# Patient Record
Sex: Female | Born: 1935 | Marital: Married | Smoking: Former smoker
Health system: Southern US, Community
[De-identification: ages and names within clinical notes are randomized; demographics above are authoritative.]

## PROBLEM LIST (undated history)

## (undated) DIAGNOSIS — J449 Chronic obstructive pulmonary disease, unspecified: Secondary | ICD-10-CM

## (undated) DIAGNOSIS — R079 Chest pain, unspecified: Secondary | ICD-10-CM

## (undated) DIAGNOSIS — I10 Essential (primary) hypertension: Secondary | ICD-10-CM

## (undated) DIAGNOSIS — E785 Hyperlipidemia, unspecified: Secondary | ICD-10-CM

## (undated) DIAGNOSIS — R4781 Slurred speech: Secondary | ICD-10-CM

## (undated) DIAGNOSIS — Z87891 Personal history of nicotine dependence: Secondary | ICD-10-CM

## (undated) DIAGNOSIS — I480 Paroxysmal atrial fibrillation: Secondary | ICD-10-CM

## (undated) DIAGNOSIS — A084 Viral intestinal infection, unspecified: Secondary | ICD-10-CM

## (undated) HISTORY — DX: Essential (primary) hypertension: I10

## (undated) HISTORY — DX: Chronic obstructive pulmonary disease, unspecified: J44.9

## (undated) HISTORY — PX: WISDOM TOOTH EXTRACTION: SHX21

## (undated) HISTORY — DX: Chest pain, unspecified: R07.9

## (undated) HISTORY — PX: HEMORRHOID SURGERY: SHX153

## (undated) HISTORY — PX: PARTIAL HYSTERECTOMY: SHX80

## (undated) HISTORY — DX: Hyperlipidemia, unspecified: E78.5

## (undated) HISTORY — DX: Personal history of nicotine dependence: Z87.891

## (undated) HISTORY — DX: Slurred speech: R47.81

## (undated) HISTORY — DX: Paroxysmal atrial fibrillation: I48.0

## (undated) HISTORY — DX: Viral intestinal infection, unspecified: A08.4

## (undated) HISTORY — PX: TONSILLECTOMY: SUR1361

---

## 2011-08-06 ENCOUNTER — Encounter: Payer: Self-pay | Admitting: Cardiovascular Disease

## 2011-08-06 ENCOUNTER — Inpatient Hospital Stay: Payer: Self-pay | Admitting: Internal Medicine

## 2011-08-06 DIAGNOSIS — I499 Cardiac arrhythmia, unspecified: Secondary | ICD-10-CM

## 2011-08-06 DIAGNOSIS — I517 Cardiomegaly: Secondary | ICD-10-CM

## 2011-08-06 LAB — CBC
HCT: 44.5 % (ref 35.0–47.0)
MCHC: 33.9 g/dL (ref 32.0–36.0)
MCV: 95 fL (ref 80–100)
Platelet: 247 10*3/uL (ref 150–440)
RDW: 12.8 % (ref 11.5–14.5)

## 2011-08-06 LAB — URINALYSIS, COMPLETE
Glucose,UR: NEGATIVE mg/dL (ref 0–75)
Leukocyte Esterase: NEGATIVE
Nitrite: NEGATIVE
RBC,UR: 4 /HPF (ref 0–5)
Specific Gravity: 1.016 (ref 1.003–1.030)
Squamous Epithelial: 4
WBC UR: 2 /HPF (ref 0–5)

## 2011-08-06 LAB — COMPREHENSIVE METABOLIC PANEL
Albumin: 4 g/dL (ref 3.4–5.0)
Anion Gap: 15 (ref 7–16)
Bilirubin,Total: 0.8 mg/dL (ref 0.2–1.0)
Calcium, Total: 8.9 mg/dL (ref 8.5–10.1)
Co2: 27 mmol/L (ref 21–32)
EGFR (African American): >60
SGOT(AST): 27 U/L (ref 15–37)
Total Protein: 7.8 g/dL (ref 6.4–8.2)

## 2011-08-06 LAB — CK TOTAL AND CKMB (NOT AT ARMC)
CK, Total: 62 U/L (ref 21–215)
CK, Total: 52 U/L (ref 21–215)
CK-MB: 1 ng/mL (ref 0.5–3.6)
CK-MB: 0.9 ng/mL (ref 0.5–3.6)
CK-MB: 0.9 ng/mL (ref 0.5–3.6)

## 2011-08-06 LAB — LIPASE, BLOOD: Lipase: 198 U/L (ref 73–393)

## 2011-08-06 LAB — TROPONIN I: Troponin-I: <0.02 ng/mL

## 2011-08-07 LAB — CBC WITH DIFFERENTIAL/PLATELET
Basophil #: 0 10*3/uL (ref 0.0–0.1)
HCT: 36 % (ref 35.0–47.0)
Lymphocyte #: 0.5 10*3/uL — ABNORMAL LOW (ref 1.0–3.6)
MCHC: 33.7 g/dL (ref 32.0–36.0)
MCV: 95 fL (ref 80–100)
Monocyte %: 9.6 %
Neutrophil #: 6.6 10*3/uL — ABNORMAL HIGH (ref 1.4–6.5)
Neutrophil %: 83.8 %
RDW: 12.8 % (ref 11.5–14.5)
WBC: 7.9 10*3/uL (ref 3.6–11.0)

## 2011-08-07 LAB — COMPREHENSIVE METABOLIC PANEL
Albumin: 3 g/dL — ABNORMAL LOW (ref 3.4–5.0)
Alkaline Phosphatase: 46 U/L — ABNORMAL LOW (ref 50–136)
BUN: 8 mg/dL (ref 7–18)
Bilirubin,Total: 0.5 mg/dL (ref 0.2–1.0)
Calcium, Total: 7 mg/dL — CL (ref 8.5–10.1)
EGFR (Non-African Amer.): >60
Osmolality: 276 (ref 275–301)
Potassium: 3.5 mmol/L (ref 3.5–5.1)
SGPT (ALT): 18 U/L
Sodium: 139 mmol/L (ref 136–145)

## 2011-08-07 LAB — MAGNESIUM: Magnesium: 1.6 mg/dL — ABNORMAL LOW

## 2011-08-08 LAB — BASIC METABOLIC PANEL
Anion Gap: 6 — ABNORMAL LOW (ref 7–16)
Calcium, Total: 7.4 mg/dL — ABNORMAL LOW (ref 8.5–10.1)
Co2: 25 mmol/L (ref 21–32)
Creatinine: 0.57 mg/dL — ABNORMAL LOW (ref 0.60–1.30)
EGFR (African American): >60
Osmolality: 259 (ref 275–301)
Sodium: 130 mmol/L — ABNORMAL LOW (ref 136–145)

## 2011-08-08 LAB — MAGNESIUM: Magnesium: 2 mg/dL

## 2011-08-14 ENCOUNTER — Encounter: Payer: Self-pay | Admitting: Nurse Practitioner

## 2011-08-14 ENCOUNTER — Ambulatory Visit (INDEPENDENT_AMBULATORY_CARE_PROVIDER_SITE_OTHER): Payer: Medicare Other | Admitting: Nurse Practitioner

## 2011-08-14 VITALS — BP 140/68 | HR 86 | Ht 61.0 in | Wt 144.0 lb

## 2011-08-14 DIAGNOSIS — I4891 Unspecified atrial fibrillation: Secondary | ICD-10-CM

## 2011-08-14 DIAGNOSIS — J449 Chronic obstructive pulmonary disease, unspecified: Secondary | ICD-10-CM

## 2011-08-14 DIAGNOSIS — E785 Hyperlipidemia, unspecified: Secondary | ICD-10-CM

## 2011-08-14 DIAGNOSIS — R079 Chest pain, unspecified: Secondary | ICD-10-CM

## 2011-08-14 DIAGNOSIS — I480 Paroxysmal atrial fibrillation: Secondary | ICD-10-CM

## 2011-08-14 DIAGNOSIS — I10 Essential (primary) hypertension: Secondary | ICD-10-CM

## 2011-08-14 DIAGNOSIS — Z87891 Personal history of nicotine dependence: Secondary | ICD-10-CM | POA: Insufficient documentation

## 2011-08-14 MED ORDER — METOPROLOL TARTRATE 50 MG PO TABS: 50.0000 mg | ORAL_TABLET | Freq: Two times a day (BID) | ORAL | Status: AC

## 2011-08-14 MED ORDER — FLUTICASONE-SALMETEROL 100-50 MCG/DOSE IN AEPB: 1.0000 | INHALATION_SPRAY | Freq: Two times a day (BID) | RESPIRATORY_TRACT | Status: AC

## 2011-08-14 NOTE — Progress Notes (Signed)
Patient Name: Alyssa Tucker Date of Encounter: 08/14/2011  Primary Care Provider:  Dorothey Baseman, MD, MD Primary Cardiologist:  Concha Se, MD  Patient Profile  78 y/o female who presents following recent hospitalization for gastroenteritis complicated by af w/ rvr.  Problem List   Past Medical History  Diagnosis Date  . Viral gastroenteritis     a. 07/2011  . Paroxysmal a-fib     a. Dx 07/2011;  b. 08/06/2011 Echo: EF >555, Diast dysfxn, NL LA size, no signif valvular abnormalities.  The 40s and he is longer than the left and in the ICU the day of his side of his  . Hypertension   . COPD (chronic obstructive pulmonary disease)     a. Rx O2 for exertion in past - doesn't use.  Marland Kitchen Hyperlipidemia   . Slurred speech     a. 07/2010 during hospitalization - NL Carotid dopplers  . History of tobacco abuse     a. 40 pack yr history - no longer smokes.  . Exertional chest pain     a. 07/2011 - refusing myoview @ this time.   Past Surgical History  Procedure Date  . Wisdom tooth extraction   . Partial hysterectomy   . Tonsillectomy   . Hemorrhoid surgery     Allergies  No Known Allergies  HPI  78 y/o female with the above problem list.  She was recently hospitalized @ [facility] with gastroenteritis.  She was found to have tachycardia and A. Fib with RVR which subsequently broke while on beta blocker therapy.  As atrial fibrillation appear to be secondary to systemic illness, she was not placed on systemic anticoagulation.  She was discharged home on metoprolol therapy however she chose not to have that prescription filled instead has continued to take Diovan which he has been on for hypertension for a few years.  She does not feel that she had any additional palpitations consistent with A. Fib.  The patient has chronic dyspnea on exertion and when she was discharged from the hospital was placed on oxygen therapy to be used with exertion.  She says her breathing is improved significantly  since discharge and she is no longer using the oxygen.  Patient also reports some degree of chest tightness that occurs with exertion.  She says she simply doesn't exert herself as much in order to avoid experiencing chest tightness.  She is fairly nondescript regarding how long this has been going on and is unwilling to commit to how often it occurs.  Home Medications  Prior to Admission medications   Medication Sig Start Date End Date Taking? Authorizing Provider  Fluticasone-Salmeterol (ADVAIR) 100-50 MCG/DOSE AEPB Inhale 1 puff into the lungs every 12 (twelve) hours. 08/14/11  Yes Ok Anis, NP  valsartan (DIOVAN) 160 MG tablet Take 160 mg by mouth daily.   Yes Historical Provider, MD  aspirin EC 81 MG tablet Take 1 tablet (81 mg total) by mouth daily. 08/14/11   Ok Anis, NP  metoprolol (LOPRESSOR) 50 MG tablet Take 1 tablet (50 mg total) by mouth 2 (two) times daily. 08/14/11 08/13/12  Ok Anis, NP    Family History  Family History  Problem Relation Age of Onset  . Dementia Mother     died @ 80    Social History  History   Social History  . Marital Status: Married    Spouse Name: N/A    Number of Children: N/A  . Years of Education: N/A  Occupational History  . retired    Social History Main Topics  . Smoking status: Former Smoker -- 1.0 packs/day for 40 years    Types: Cigarettes    Quit date: 08/14/2003  . Smokeless tobacco: Not on file  . Alcohol Use: 0.5 oz/week    1 drink(s) per week     Previously drank a glass of wine with dinner everynight but now only has an occasional glass.  . Drug Use: No  . Sexually Active: Not on file   Other Topics Concern  . Not on file   Social History Narrative   Lives with husband in local retirement community.  Dtr is nearby.  Does not routinely exercise.     Review of Systems General:  No chills, fever, night sweats or weight changes.  Cardiovascular:  +++ chest pain & dyspnea on exertion.   No edema, orthopnea, palpitations, paroxysmal nocturnal dyspnea. Dermatological: No rash, lesions/masses Respiratory: No cough, dyspnea Urologic: No hematuria, dysuria Abdominal:   No nausea, vomiting, diarrhea, bright red blood per rectum, melena, or hematemesis Neurologic:  No visual changes, wkns, changes in mental status. All other systems reviewed and are otherwise negative except as noted above.  Physical Exam  Blood pressure 140/68, pulse 86, height 5\' 1"  (1.549 m), weight 144 lb (65.318 kg).  General: Pleasant, NAD Psych: Normal affect. Neuro: Alert and oriented X 3. Moves all extremities spontaneously. HEENT: Normal  Neck: Supple without bruits or JVD. Lungs:  Resp regular and unlabored.  Diminished breath sounds bilat. Heart: RRR no s3, s4, or murmurs. Abdomen: Soft, non-tender, non-distended, BS + x 4.  Extremities: No clubbing, cyanosis or edema. DP/PT/Radials 2+ and equal bilaterally.  Accessory Clinical Findings  ECG - RSR, 86, RBBB, poor R prog, LAD. - No acute changes.  Assessment & Plan  1.  PAF:  Pt had PAF in setting of presumed viral gastroenteritis and UTI.  She was prescribed lopressor 50mg  bid @ the time of d/c from the hospital but she preferred to continue to take her home dose of Diovan instead.  We discussed why metoprolol may be a better choice for treatment of her BP given her dx of afib.  She is willing to accept a Rx for metoprolol but is clear that she plans to read up on it first and may or may not pick it up @ the pharmacy.  We've sent in a Rx for metoprolol 50mg  bid to her pharmacy.  She's advised that when she begins to take it, she can stop her Diovan.  Her CHADS2= 2.  As her afib occurred in the setting of a viral illness with hypokalemia and hypomagnesemia, we will not pursue systemic anticoagulation at this time.  I have advised however that she begin using ASA 81mg  Daily - which she is willing to do.  2.  HTN:  See discussion above re: switching  from diovan to metoprolol.  3.  COPD:  Pt was prescribed O2 for home use during exertion.  She says she feels much better and hasn't been using it.  We performed ambulatory pulse oximetry while she was here today and she maintained her saturations above 92% (<88% is cutoff for insurance coverage).  This being the case, it does not appear that she requires O2 @ this point.  She uses advair bid @ home.  She does not currently have a PCP and has requested a refill, this has been e-scribed for her.  4.  Midsternal Chest pain:  When pt was  hospitalized, even with fast heart rates, she apparently did not have any chest pain.  When questioned today, she does report a h/o exertional chest discomfort on occasion, which she says she can control simply by not exerting herself.  We discussed stress testing at length, which was also discussed with her while she was hospitalized, and she does not wish to proceed with pharmacologic testing at this time.  I advised that if she notes recurrent chest discomfort or worsening DOE, that she should contact us so that we can arrange an ischemic evaluation.  Her and her dtr were agreeable with that approach.  BB prescribed as above.   Nicolasa Ducking, NP 08/14/2011, 4:49 PM

## 2011-08-14 NOTE — Patient Instructions (Signed)
Your physician recommends that you schedule a follow-up appointment in: 3 MONTHS WITH DR. Mariah Milling  Your physician has recommended you make the following change in your medication: WHEN YOU DO STOP THE DIOVAN YOU ARE TO START METOPROLOL TARTRATE (LOPRESSOR) 50 MG 1 TABLET TWICE DAILY

## 2011-08-18 ENCOUNTER — Telehealth: Payer: Self-pay | Admitting: Cardiovascular Disease

## 2011-08-18 NOTE — Telephone Encounter (Signed)
Pt wants Dr to write script for her to DC her O2. Pt saw Ward Givens on 3/28 and she says he told her he would do it.

## 2011-08-18 NOTE — Telephone Encounter (Signed)
REVIEWED LAST OFFICE NOTE APPEARS PT NO LONGER REQUIRES O2 THERAPY NOTE WRITTEN ON PERSCRIPTION PAD AND FAXED TO  [facility]  AT [phone]

## 2011-09-16 ENCOUNTER — Encounter: Payer: Self-pay | Admitting: Cardiovascular Disease

## 2012-02-27 ENCOUNTER — Ambulatory Visit: Payer: Self-pay | Admitting: Family Medicine

## 2012-03-10 ENCOUNTER — Ambulatory Visit: Payer: Self-pay | Admitting: Family Medicine

## 2012-09-03 IMAGING — CR DG CHEST 1V PORT
1 series · 1 of 1 positions shown · non-contrast
Comparison: none

REASON FOR EXAM: short of breath
COMMENTS:

[portable]
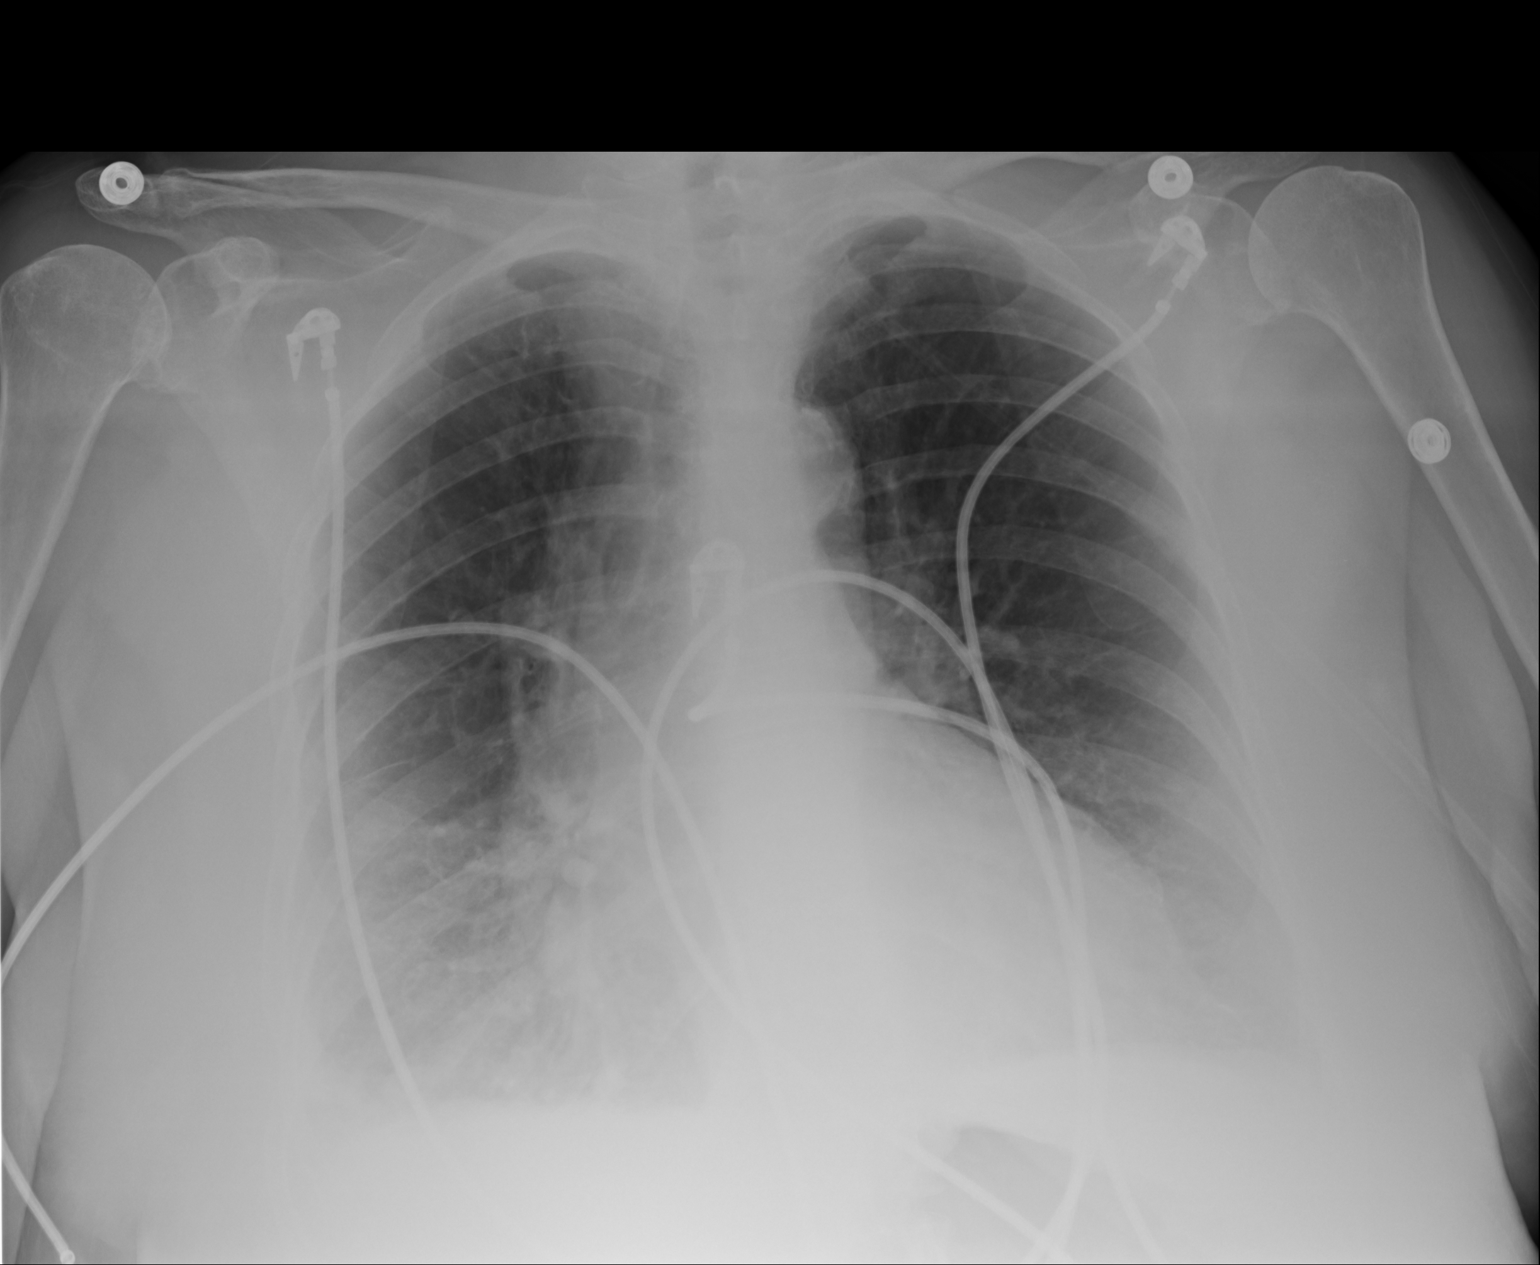

[1 of 1 positions shown; findings below may reference images not displayed]

PROCEDURE:     DXR - DXR PORTABLE CHEST SINGLE VIEW  - August 08, 2011  [DATE]

RESULT:     Comparison is made to the study 06 August, 2011.

Cardiac monitoring electrodes are present. The projection is lordotic. The
lungs are mildly hyperinflated suggestive of COPD. The lungs are clear. The
heart and pulmonary vessels are normal. The bony and mediastinal structures
are unremarkable. There is no effusion. There is no pneumothorax or evidence
of congestive failure.
IMPRESSION: No acute cardiopulmonary disease. COPD.

## 2013-03-25 IMAGING — CT CT CHEST W/ CM
1 series · 15 of 33 positions shown, 19 images · IV contrast (isovue)
Comparison: none

REASON FOR EXAM: LAB 1st  cough  abnormal  CXR   dr req W contrast
COMMENTS:

PROCEDURE:     KCT - KCT CHEST WITH CONTRAST  - February 27, 2012 [DATE]
RESULT:     Comparison: Report from chest radiograph of 02/17/2012.
TECHNIQUE: Multiple axial images of the chest were obtained with 75 mL
Isovue 300 intravenous contrast.

[Series 2: chest w/ 3.0 i31f 2 · axial · 0.70mm/px · z∈[-505,-259]mm · 15 of 98 slices shown, 19 images]
[im 8/98  mediastinal]
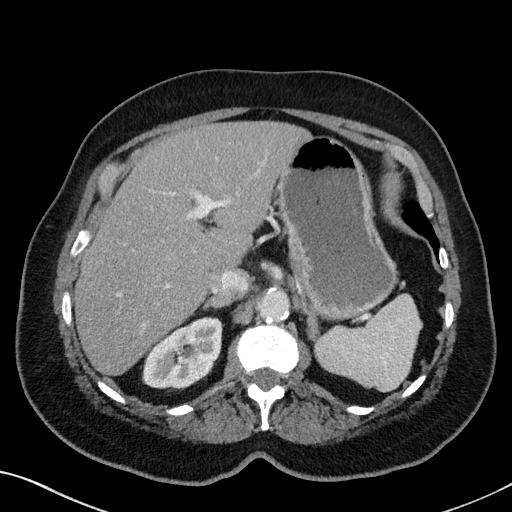
[im 8/98  lung]
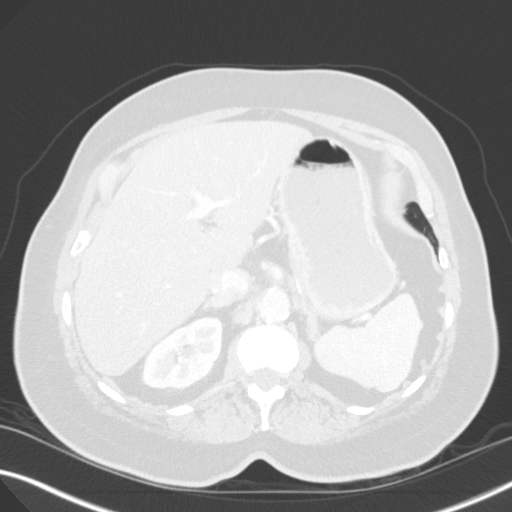
[im 15/98  lung]
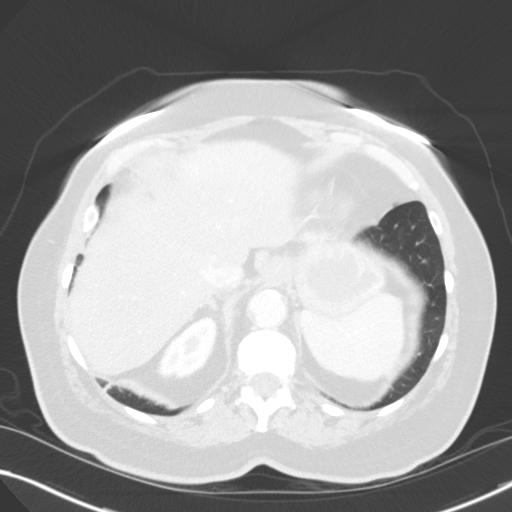
[im 20/98  lung]
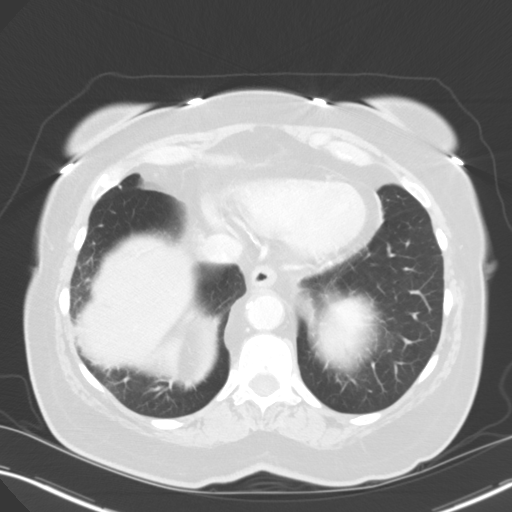
[im 26/98  lung]
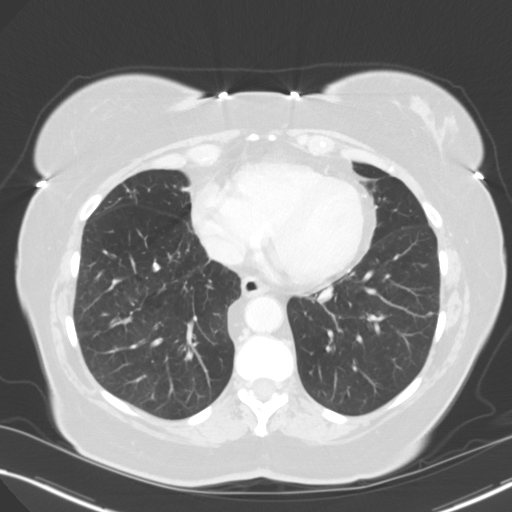
[im 33/98  mediastinal]
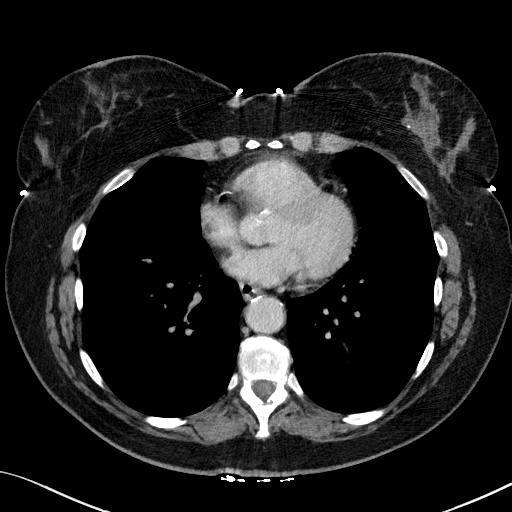
[im 33/98  lung]
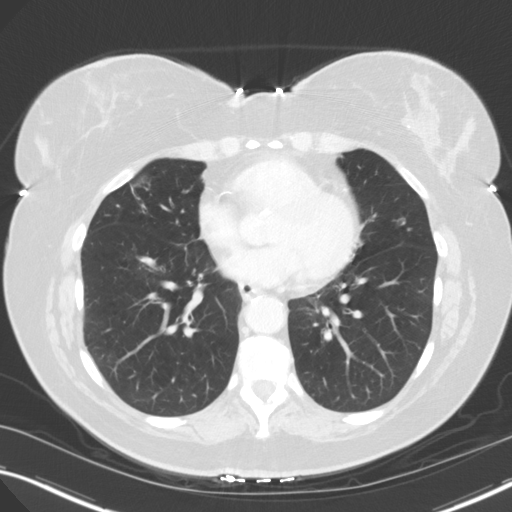
[im 39/98  lung]
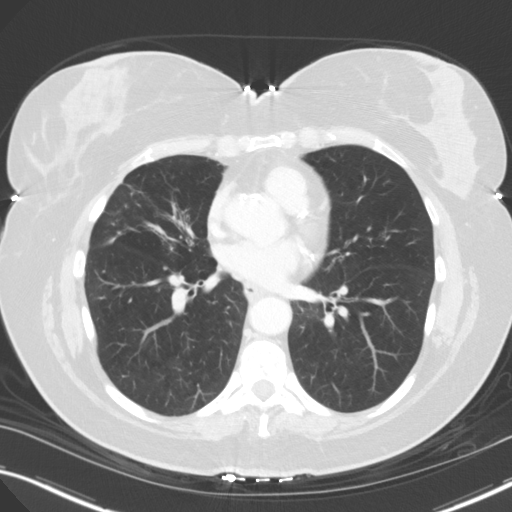
[im 44/98  lung]
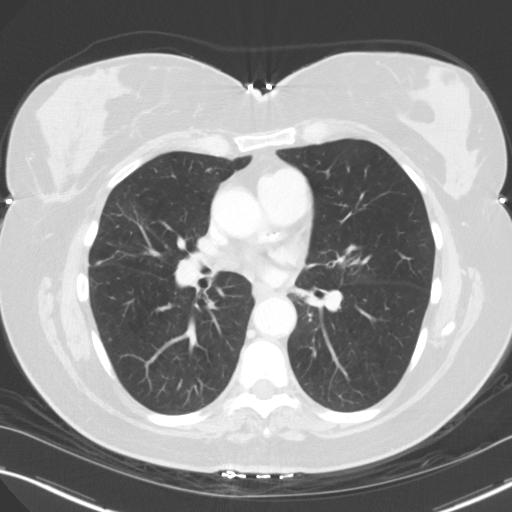
[im 51/98  lung]
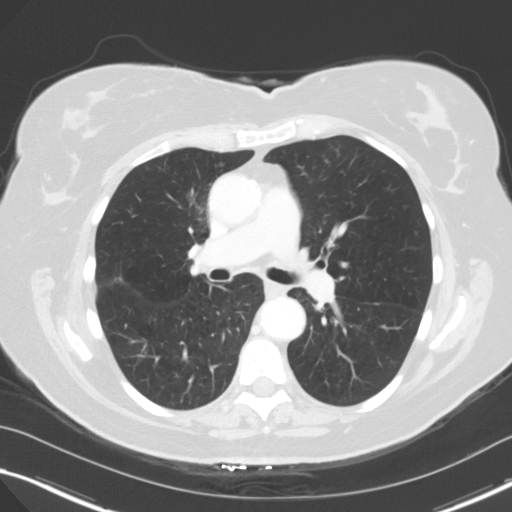
[im 54/98  mediastinal]
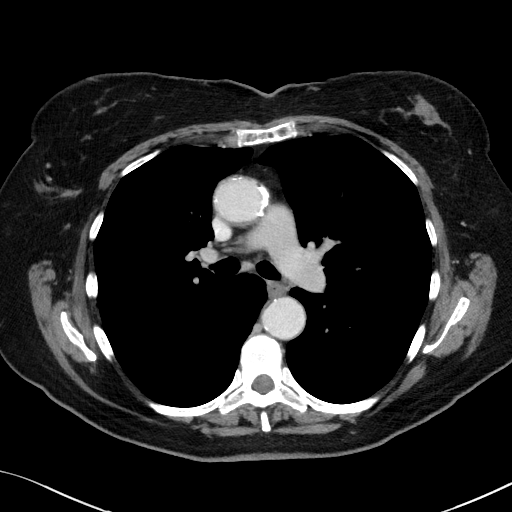
[im 54/98  lung]
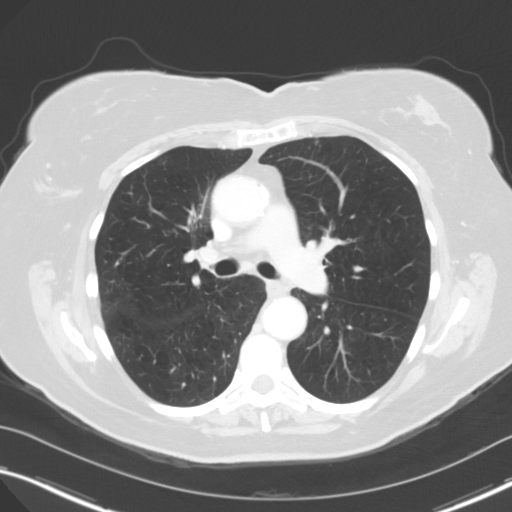
[im 59/98  lung]
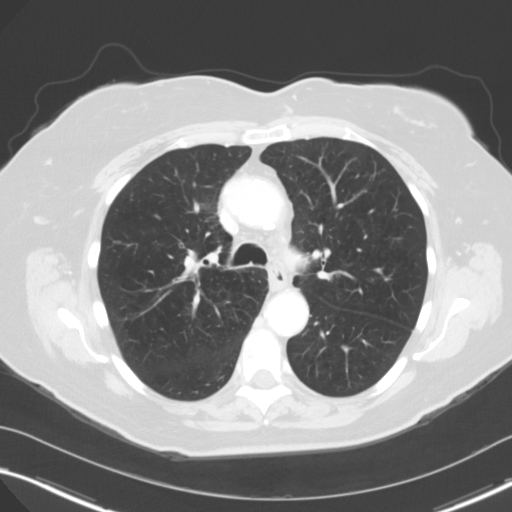
[im 65/98  lung]
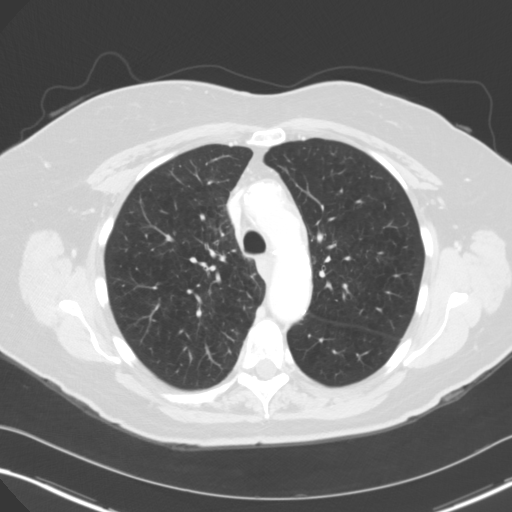
[im 72/98  lung]
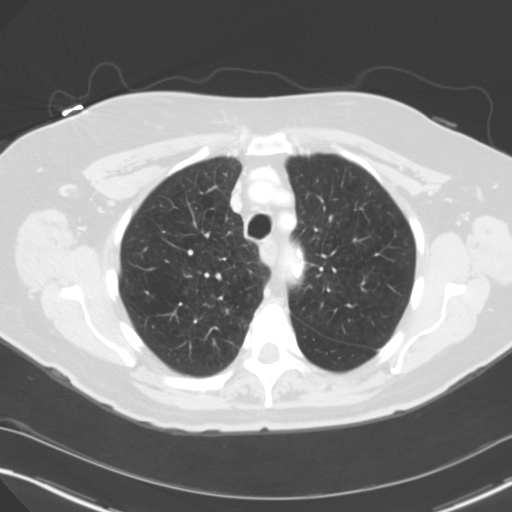
[im 78/98  mediastinal]
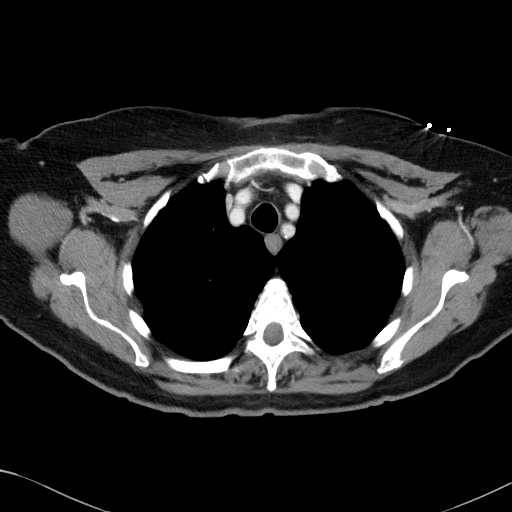
[im 78/98  lung]
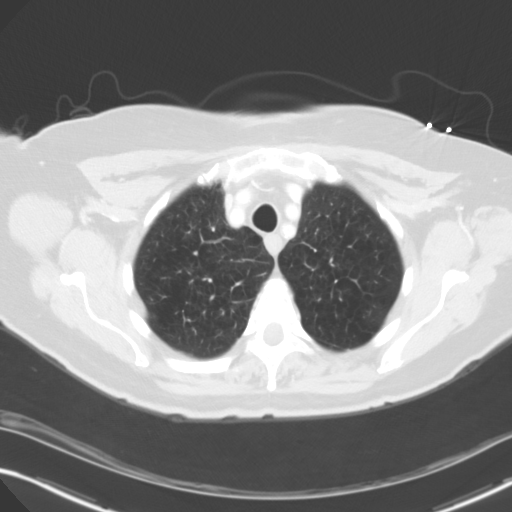
[im 83/98  lung]
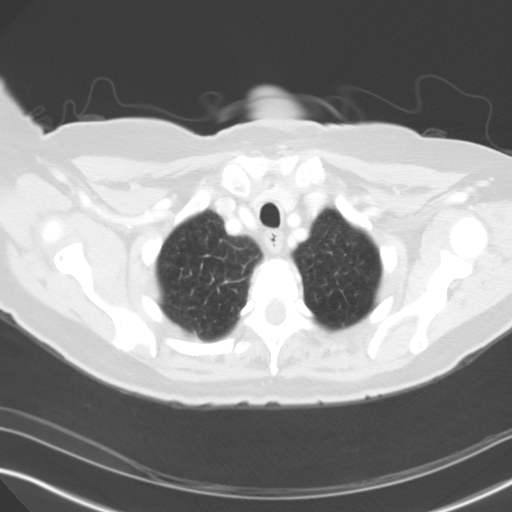
[im 90/98  lung]
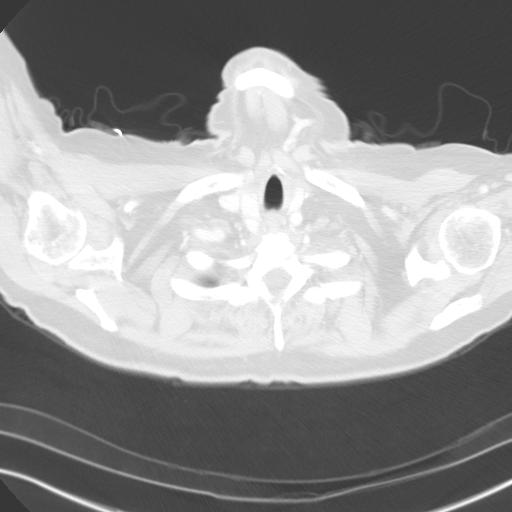

[15 of 33 positions shown; findings below may reference images not displayed]

FINDINGS: No mediastinal, hilar, or axillary lymphadenopathy. Calcifications are seen
in the coronary arteries. There is a possible small hiatal hernia.

There is moderate centrilobular emphysema. There is a 5 mm nodule in the
right middle lobe, image 57. Minimal linear opacities in the right middle
lobe are likely secondary to atelectasis or scarring. There is a 6 mm nodule
in the subpleural right lower lobe, image 62. There is a 5 mm nodule in the
subpleural left lower lobe, image 72. The central airways are patent.

No aggressive lytic or sclerotic osseous lesions are identified.
IMPRESSION: 1. Moderate centrilobular emphysema. Minimal linear opacities in the right
middle lobe are likely secondary to atelectasis.
2. Multiple indeterminate pulmonary nodules, the largest of which measures 6
mm. Recommend follow-up noncontrast chest CT in 3 to 6 months.

## 2013-04-06 IMAGING — MG MM CAD SCREENING MAMMO
1 series · 4 of 4 positions shown · non-contrast
Comparison: Mammograms from [REDACTED] 08/02/2008, digital
copies of screen film mammograms from 08/02/2007.

REASON FOR EXAM: SCR MAMMO NO ORDER
COMMENTS:

PROCEDURE:     MAM - MAM DGTL SCRN MAM NO ORDER W/CAD  - March 10, 2012  [DATE]
RESULT:

[R CC · right · 4 of 4 slices shown]
[im 1/4]
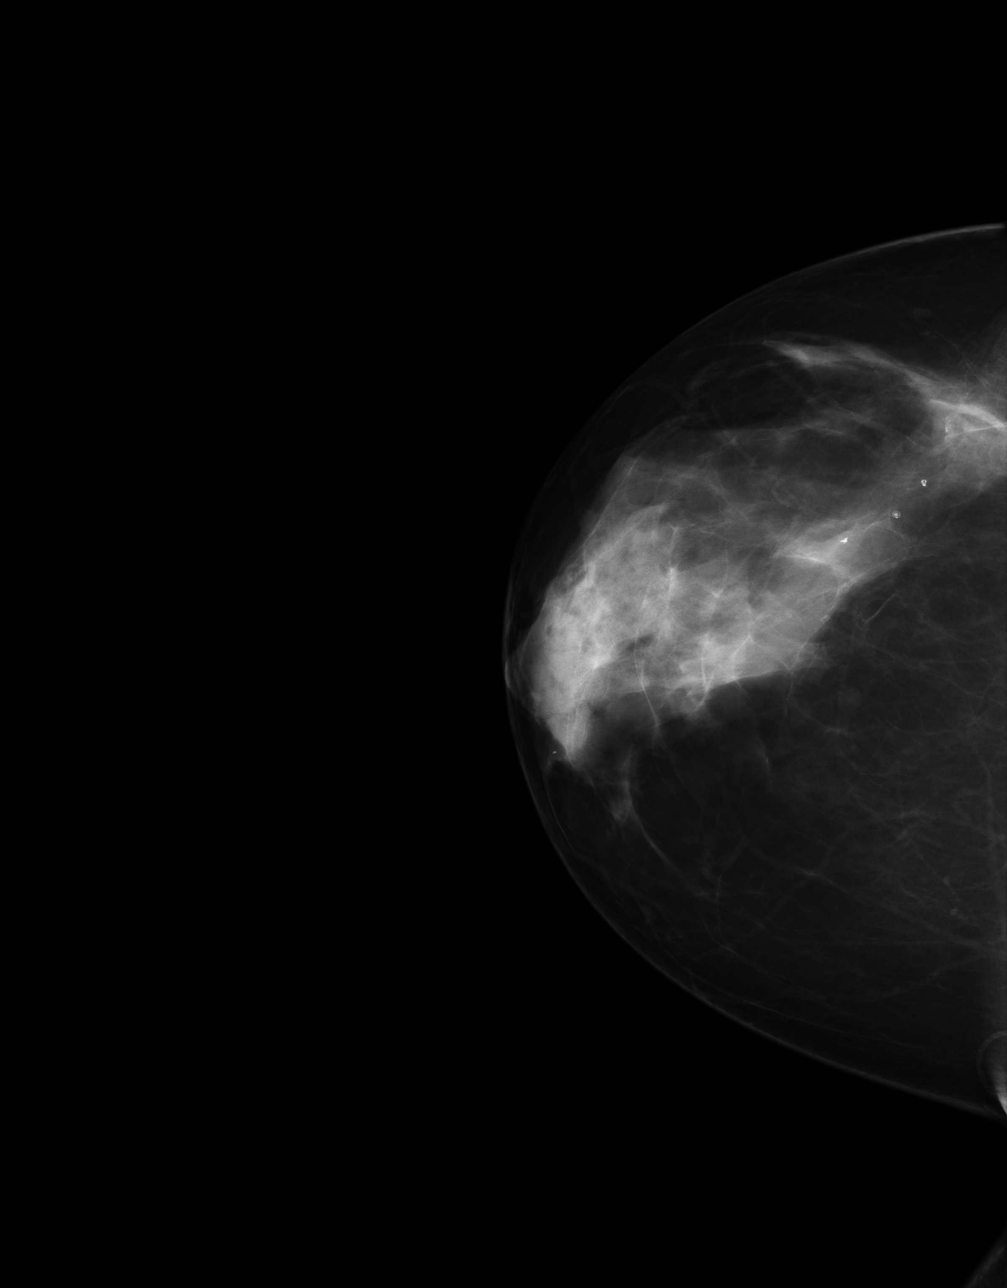
[im 2/4]
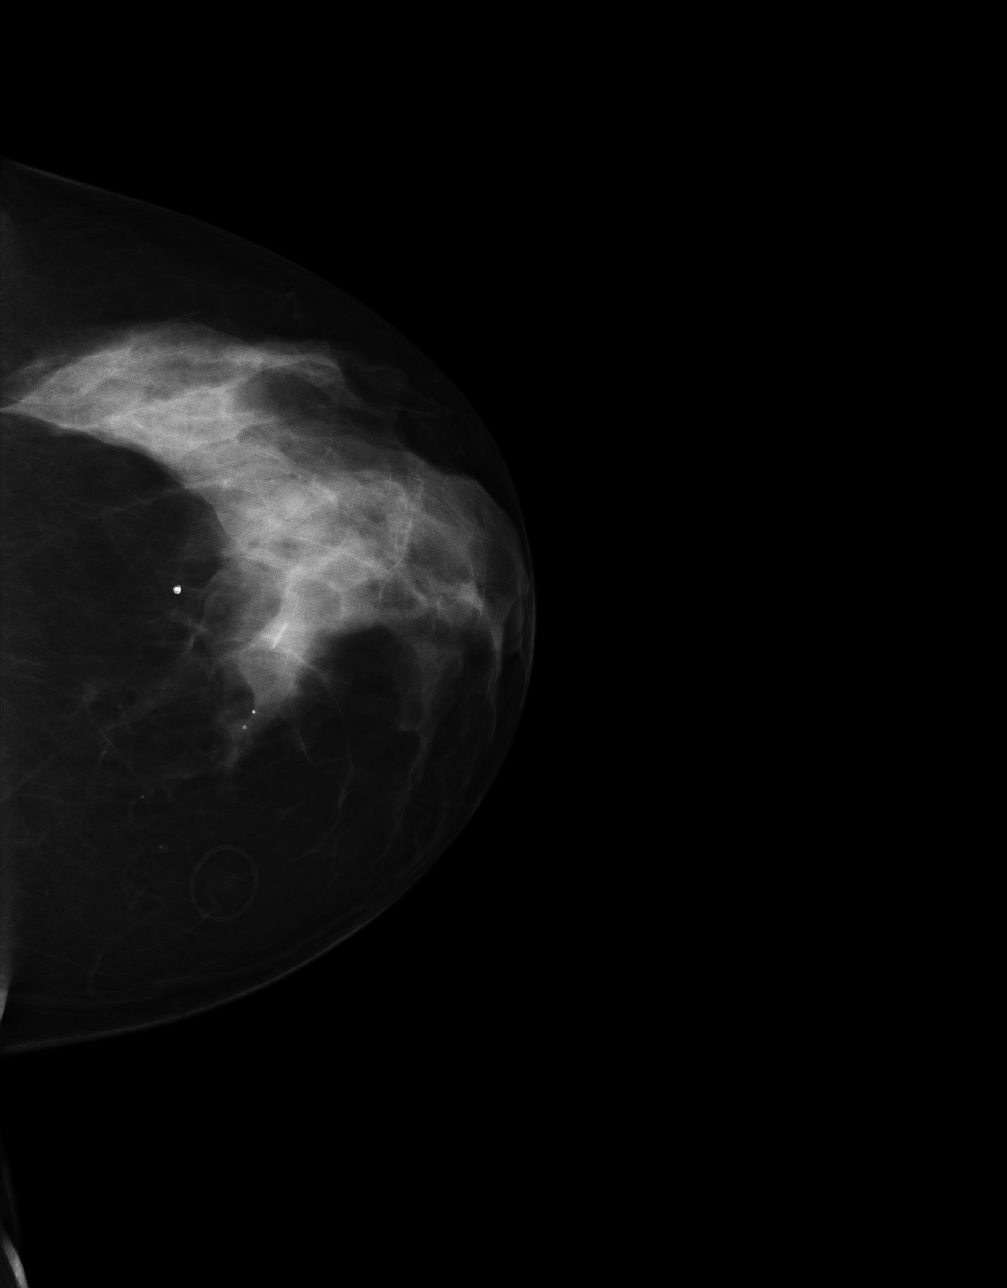
[im 3/4]
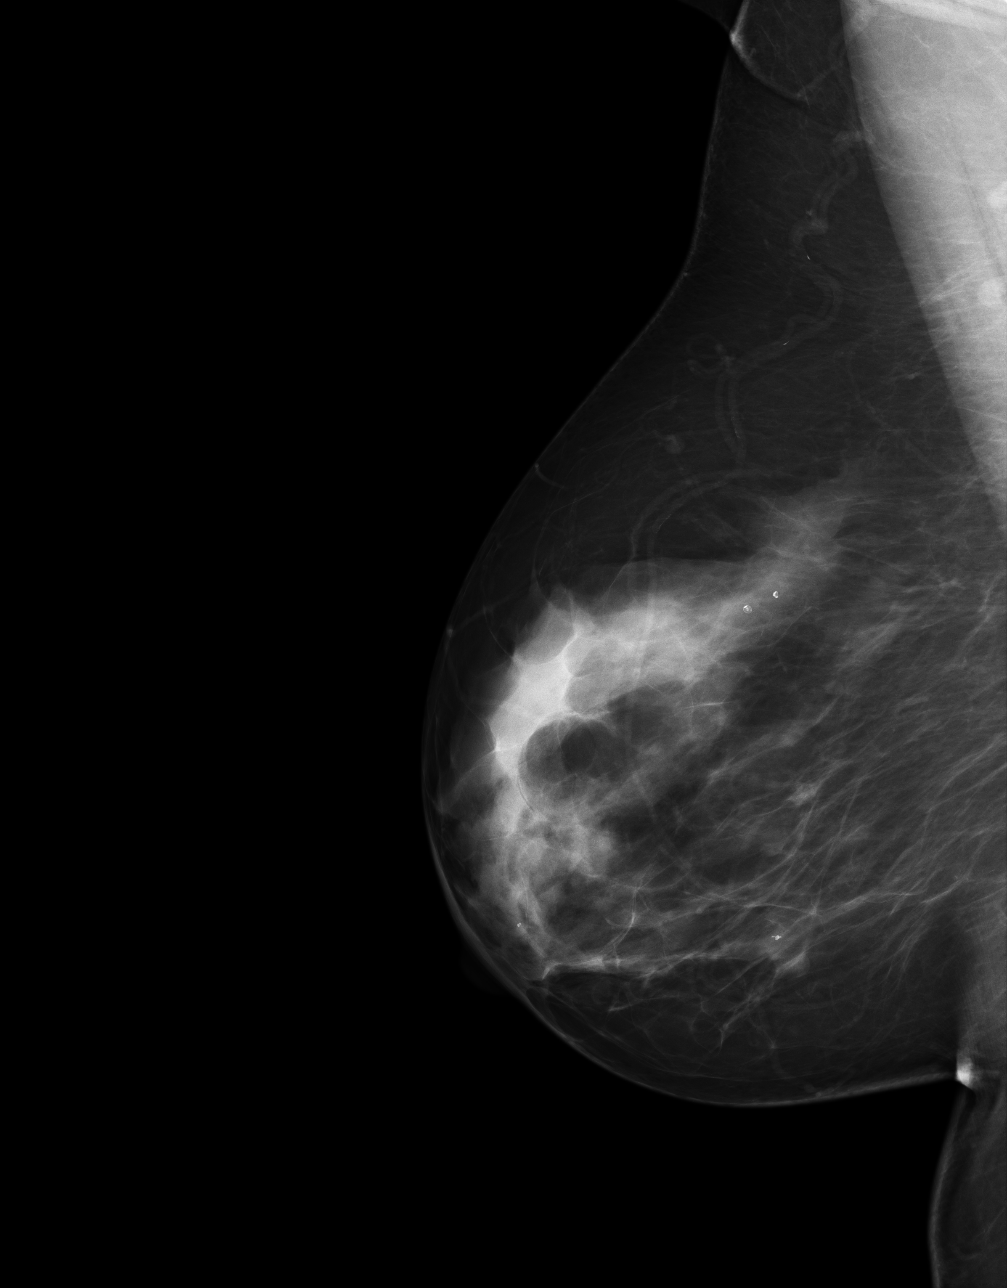
[im 4/4]
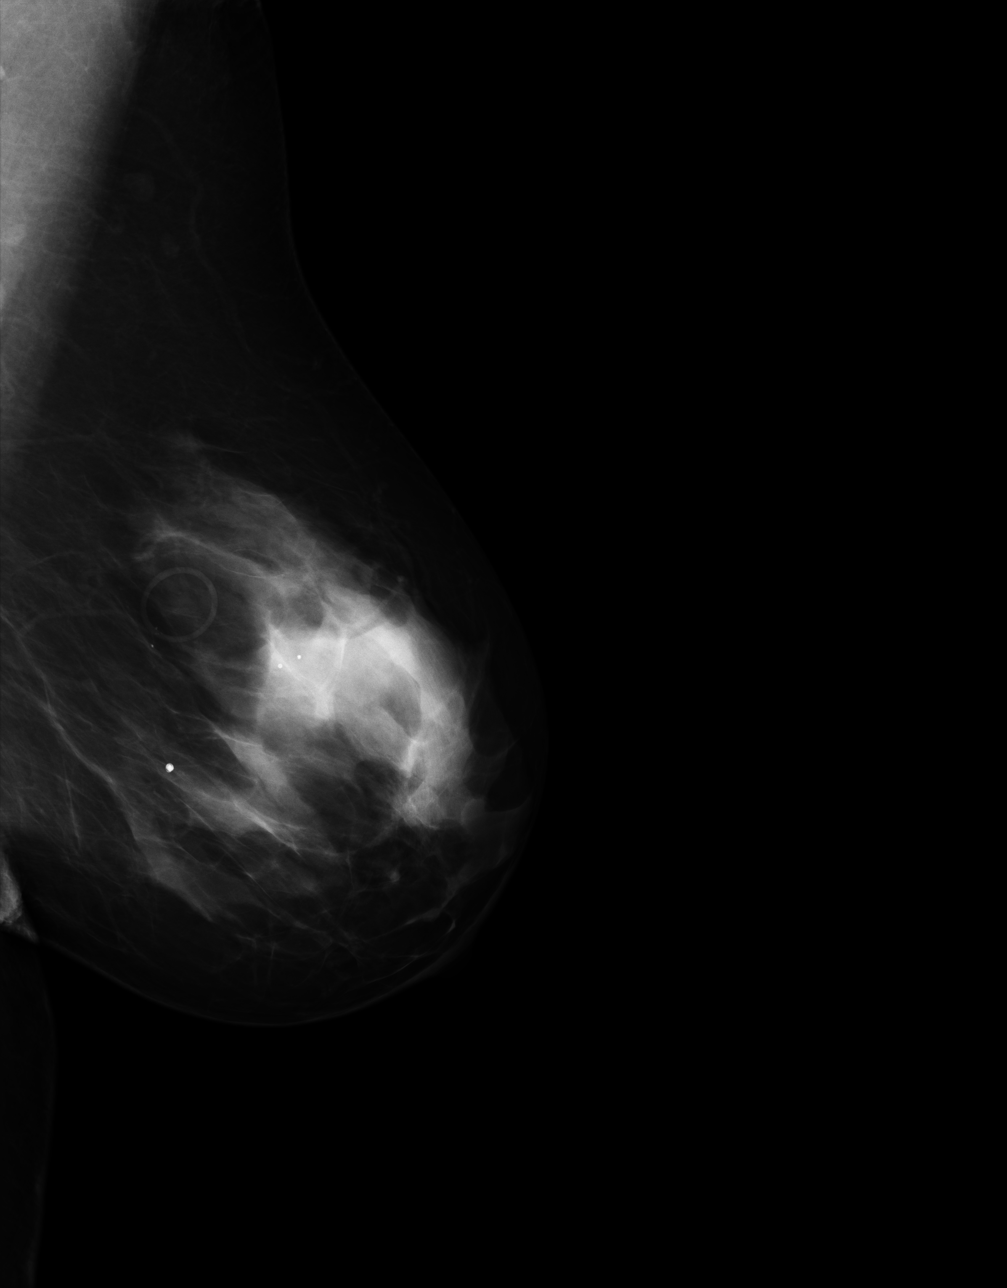

[4 of 4 positions shown; findings below may reference images not displayed]

FINDINGS: The breast tissue is heterogeneously dense, which may lower the sensitivity
of mammography. No suspicious masses or calcifications are identified. No
areas of architectural distortion.
IMPRESSION: 1.  BI-RADS: Category 1 Negative.
2.  Recommend continued annual screening mammography.

Thank you for this opportunity to contribute to the care of your patient.

A NEGATIVE MAMMOGRAM REPORT DOES NOT PRECLUDE BIOPSY OR OTHER EVALUATION OF
A CLINICALLY PALPABLE OR OTHERWISE SUSPICIOUS MASS OR LESION. BREAST CANCER
MAY NOT BE DETECTED BY MAMMOGRAPHY IN UP TO 10% OF CASES.

## 2013-07-17 DEATH — deceased

## 2014-09-10 NOTE — Discharge Summary (Signed)
PATIENT NAME:  Alyssa Tucker, Alyssa Tucker MR#:  [id number] DATE OF BIRTH:  06-25-1933  DATE OF ADMISSION:  08/06/2011 DATE OF DISCHARGE:  08/08/2011  DISCHARGE DIAGNOSES:  1. Acute viral gastroenteritis.  2. Urinary tract infection.  3. Paroxysmal atrial fibrillation.   4. Hypertension.  5. Chronic obstructive pulmonary disease with oxygen needed on exertion.  6. History of hemorrhoids.   DISCHARGE MEDICATIONS:  1. Cipro 500 mg p.o. b.i.d. for five days.  2. Metoprolol 50 mg p.o. b.i.d.   NOTE: Do not take Diovan that she was taking before.  OXYGEN: 2 liters on exertion.   DIET: Low sodium diet.   FOLLOW UP: Follow up with Dr. Julien Nordmannimothy Gollan on 08/14/2011 at 2:00 p.m.   DISPOSITION: Patient discharged home.   CONSULTATION: Cardiology consult with Dr. Julien Nordmannimothy Gollan.   LABORATORY, DIAGNOSTIC AND RADIOLOGICAL DATA: EKG showed sinus tach 116 beats per minute. Magnesium 1.6. Troponin less than 0.02. Electrolytes on admission: Sodium 141, potassium 3.8, chloride 99, bicarbonate 27, BUN 15, creatinine 0.74, glucose 145. Liver function within normal limits. WBC on admission 16.5, hemoglobin 15.1, hematocrit 44.5, platelets 247.   EKG repeat on 08/06/2011 showed atrial fibrillation with 157 beats per minute.   Urinalysis showed hazy urine with 3+ bacteria. CT of the head showed involutional changes without any evidence of acute abnormality.   Patient's chest x-ray showed no evidence of cardiopulmonary disease. TSH 0.617. D-dimer 1.44. Echocardiogram showed ejection fraction more than 55% suggestive of left ventricular hypertrophy and also impaired LV relaxation. Ultrasound of carotid showed no hemodynamically significant stenosis.   Troponin less than 0.02. CK total and CPK-MB are within normal limits. Repeat BMP on 08/07/2011: Sodium 139, potassium 3.5, chloride 103, bicarbonate 26, BUN 8, creatinine 0.59, glucose 101. WBC also came back normal on 03/21. WBC 7.9, hemoglobin 12.1, hematocrit 36,  platelets 186. Urine cultures did not show any growth. Repeat magnesium on 08/08/2011 is 2. Chest x-ray done on 08/08/2011 showed no acute cardiopulmonary disease and chronic obstructive pulmonary disease.   HOSPITAL COURSE:  1. Gastroenteritis. Look in the history and physical for full details. 81 year old female with history of chronic obstructive pulmonary disease, hypertension, also hyperlipidemia diet controlled came in because of diarrhea, vomiting with nausea. Patient did not have any hematemesis. Had several episodes of diarrhea. Patient admitted for viral gastroenteritis. Initial white count was high. She was placed with IV fluids along with IV Zofran. Patient's diarrhea resolved next day and was tolerating the diet. Stool cultures were ordered, but she could never give the stool because her stool was very loose and nurses were unable to collect but patient's symptoms resolved. Patient was very eager to go and requested that she be discharged.   2. Atrial fibrillation. Patient seen by Dr. Julien Nordmannimothy Gollan. Patient initially had sinus tachycardia up to rate of 200s. She received adenosine and then patient's tachycardia got better but she went into atrial fibrillation and converted to sinus tach 128 beats per minute. Patient had some dizziness when she came. Dr. Mariah MillingGollan saw the patient. Ejection fraction was more than 55%. Her Diovan was stopped and started on Cardizem and metoprolol but patient had blood pressure being on low side, her Cardizem doses were held. Patient's tachycardia likely related to her gastroenteritis. Patient's magnesium was also low which was replaced. Patient had paroxysmal atrial fibrillation and ejection fraction more than 55% with diastolic dysfunction. She actually was better with beta blocker and Dr. Mariah MillingGollan and is planning to do outpatient stress test and we got the electrolytes.  3. Chronic obstructive pulmonary disease on Advair. Patient has some wheezing on 08/08/2011 and was  having shortness of breath. Her oxygen saturation were like 84% at rest and on 2 liters with exertion they were 92. She told me that she was considered for oxygen but she did not take it. She has history of chronic obstructive pulmonary disease so we discharged her with 2 liters and patient to follow with Dr. Mariah Milling for stress test and with her primary doctor, [facility], as well. This is actually chronic obstructive pulmonary disease she has before and she was evaluated for home oxygen also but we do not know what happened in between but she was hypoxic here on room air on exertion and patient was discharged with oxygen 2 liters, 92%. She also had arrhythmia thought to be secondary to her chronic obstructive pulmonary disease as well and hopefully this will help her.  4. High blood pressure. Has been controlled with metoprolol. Patient's Diovan is held. Patient's blood pressure was stable at the time of discharge, 136/60, and heart rate also stayed around 69 on 08/07/2011 and 08/08/2011. Patient advised to stop Diovan and take metoprolol 50 mg b.i.d. to help with her atrial fibrillation as well. She needs stress test to evaluate for her heart function and coronary arteries as well.   5. Chronic obstructive pulmonary disease. As I mentioned she has inhalers at home that she can take and continue oxygen.  6. Patient has urinary tract infection by urinalysis and culture is negative but may have cystitis with low-grade fever as well so we actually wrote a prescription for Cipro for five days. Discharge to home in stable condition.   TIME SPENT ON DISCHARGE PREPARATION: More than 30 minutes.  ____________________________ Katha Hamming, MD sk:cms D: 08/14/2011 08:06:34 ET T: 08/14/2011 14:35:55 ET JOB#: [id number]  cc: Katha Hamming, MD, <Dictator> Katha Hamming MD ELECTRONICALLY SIGNED 08/15/2011 19:07

## 2014-09-10 NOTE — Consult Note (Signed)
General Aspect 81 year old female with past medical history significant for history of hypertension, hyperlipidemia, chronic obstructive pulmonary disease who presented to the hospital with complaints of nausea, vomiting diarrhea, found to have tachycardia with rates to 200 bpm in the ER. Cardiology was consulted for arrhythmia.  she was doing well up until  9:30 p.m. when she started having nausea vomiting.  around 11:00 p.m. she started having diarrhea. She was  weak and  not able to move her upper or lower extremities. Since 11:00 p.m. she also noted slurring of her speech.   In the Emergency Room she was noted to have sinus tachycardia with right bundle branch block. She develped a tachycardia with rate  around 200. Patient was given adenosine after which her tachycardia somewhat slowed down, however, patient went into atrial fibrillation/RVR. patient converted into sinus tachycardia at rate of 120. She  remained in sinus tach with  rate of 128    Initially on arrival systolic blood pressure was around 160. She denies any other symptoms such as lightheadedness, dizziness or chest pains and denies any palpitations or feeling palpitations at home.   Physical Exam:   GEN WD, WN, NAD    HEENT red conjunctivae    NECK supple    RESP normal resp effort  clear BS  decreased BS b/l    CARD Regular rate and rhythm  Murmur    Murmur Systolic    Systolic Murmur Out flow    ABD soft    LYMPH negative neck    EXTR negative edema    SKIN normal to palpation    NEURO motor/sensory function intact    PSYCH alert, A+O to time, place, person, good insight   Review of Systems:   Subjective/Chief Complaint N/V, diarrhea    General: Fatigue  Weakness    Skin: No Complaints    ENT: No Complaints    Eyes: No Complaints    Neck: No Complaints    Respiratory: No Complaints    Cardiovascular: No Complaints    Gastrointestinal: Nausea  Diarrhea    Genitourinary: No Complaints     Vascular: No Complaints    Musculoskeletal: No Complaints    Neurologic: No Complaints    Hematologic: No Complaints    Endocrine: No Complaints    Psychiatric: No Complaints    Review of Systems: All other systems were reviewed and found to be negative    Medications/Allergies Reviewed Medications/Allergies reviewed     COPD:    Hypertension:    [place] Tumor Removal: Left and Right Side   Hemorrhoidectomy:    Varicose Surgery:        Admit Reason:   Gastroenteritis/enteritis: (558.9) Active, ICD9, Other and unspecified noninfectious gastroenteritis and colitis  Home Medications: Medication Instructions Status  Advair Diskus 100 mcg-50 mcg inhalation powder 1 puff(s) inhaled 2 times a day Active  Diovan 160 mg oral tablet 1 tab(s) orally once a day Active   Cardiac:  20-Mar-13 01:38    CK, Total 62   CPK-MB, Serum 1.0  Routine Hem:  20-Mar-13 01:38    WBC (CBC) 16.5   RBC (CBC) 4.68   Hemoglobin (CBC) 15.1   Hematocrit (CBC) 44.5   Platelet Count (CBC) 247   MCV 95   MCH 32.2   MCHC 33.9   RDW 12.8  Routine Chem:  20-Mar-13 01:38    Lipase 198   Glucose, Serum 145   BUN 15   Creatinine (comp) 0.74   Sodium, Serum  141   Potassium, Serum 3.8   Chloride, Serum 99   CO2, Serum 27   Calcium (Total), Serum 8.9  Hepatic:  20-Mar-13 01:38    Bilirubin, Total 0.8   Alkaline Phosphatase 75   SGPT (ALT) 23   SGOT (AST) 27   Total Protein, Serum 7.8   Albumin, Serum 4.0  Routine Chem:  20-Mar-13 01:38    Osmolality (calc) 285   eGFR (African American) >60   eGFR (Non-African American) >60   Anion Gap 15  Cardiac:  20-Mar-13 01:38    Troponin I < 0.02  Routine Chem:  20-Mar-13 01:38    Magnesium, Serum 1.6   EKG:   Interpretation EKG shows sinus tachycardia rate of 119 bpm, RBBB Additional EKG shows atrial fib, RBBB, rate 160 bpm   Radiology Results: XRay:    20-Mar-13 02:27, Chest Portable Single View   Chest Portable Single View     REASON FOR EXAM:    tachycardia  COMMENTS:       PROCEDURE: DXR - DXR PORTABLE CHEST SINGLE VIEW  - Aug 06 2011  2:27AM     RESULT: The lungs are clear. The cardiac silhouette and visualized bony   skeleton are unremarkable.    IMPRESSION:      1. Chest radiograph without evidence of acute cardiopulmonary disease.          Verified By: Mikki Santee, M.D., MD  [place]:    20-Mar-13 09:26, US Carotid Doppler Bilateral   US Carotid Doppler Bilateral    REASON FOR EXAM:    slurry speech  COMMENTS:       PROCEDURE: [place]  - US CAROTID DOPPLER BILATERAL  - Aug 06 2011  9:26AM     RESULT: Indication: Slurred speech    Comparison: None    Technique: Gray-scale, color Doppler, and spectral Doppler images were   obtained of the extracranial carotid artery systems and vertebral   arteries in the neck.    Findings:  On the right, there is no significant atherosclerotic plaque. There is     evidence of prior carotid endarterectomy. Maximum peak systolic velocity   in the right CCA is 147 cm/second. Maximum peak systolic velocity in the   right ICA is 127 cm/second. Maximum peak systolic velocity in the right   ECA is 284 cm/second. The right ICA/CCA ratio is 0.87. This corresponds   to a stenosis of less than 50 %. Antegrade blood flow is documented in   the right vertebral artery.    On the left, there is no significant atherosclerotic plaque. There is   evidence of prior carotid endarterectomy. Maximum peak systolic velocity   in the left CCA is 102 cm/second. Maximum peak systolic velocity in the   left ICA is 105 cm/second. Maximum peak systolic velocity in the left ECA   is 238 cm/second. The left ICA/CCA ratio is 1.03. This corresponds to a   stenosis of less than 50 %. Antegrade blood flow is documented in the   left vertebral artery.    IMPRESSION:    1. No hemodynamically significant carotid artery stenosis.          Verified By: Jennette Banker, M.D., MD     Medrol: Insomnia, Agitation  Vital Signs/Nurse's Notes: **Vital Signs.:   20-Mar-13 07:44   Vital Signs Type Routine   Temperature Temperature (F) 99.8   Celsius 37.6   Temperature Source oral   Pulse Pulse 102   Pulse source per Dinamap  Respirations Respirations 18   Systolic BP Systolic BP 536   Diastolic BP (mmHg) Diastolic BP (mmHg) 62   Mean BP 80   BP Source Dinamap   Pulse Ox % Pulse Ox % 95   Pulse Ox Activity Level  At rest   Oxygen Delivery 2L     Impression 81 year old female with past medical history significant for history of hypertension, hyperlipidemia, chronic obstructive pulmonary disease who presented to the hospital with complaints of nausea, vomiting diarrhea, found to have tachycardia with rates to 200 bpm in the ER. Cardiology was consulted for arrhythmia.  Tele strips of tachycardia not available EKG apears to shows atial fibrillation with rate 160 bpm Current NSR  ECHO pending Suspect arrhythmia could be secondary to reecnt stress/possible electroyte abn from N/V dairrhea  -Will evalute echo when available monitor rythm on tele Advance diet a tolerated. She does have underlying COPD which may contribute to arhythmia No stress testing at this time. Stress testing could be done as an outpt onec sx resolve and feeling better   Electronic Signatures: Ida Rogue (MD)  (Signed 20-Mar-13 10:04)  Authored: General Aspect/Present Illness, History and Physical Exam, Review of System, Past Medical History, Health Issues, Home Medications, Labs, EKG , Radiology, Allergies, Vital Signs/Nurse's Notes, Impression/Plan   Last Updated: 20-Mar-13 10:04 by Ida Rogue (MD)

## 2014-09-10 NOTE — H&P (Signed)
PATIENT NAME:  Alyssa NeedleREEDY, Magaby R MR#:  [id number] DATE OF BIRTH:  11/27/1933  DATE OF ADMISSION:  08/06/2011  PRIMARY CARE PHYSICIAN: [facility]  HISTORY OF PRESENT ILLNESS: Patient is a 81 year old Caucasian female with past medical history significant for history of hypertension, hyperlipidemia, chronic obstructive pulmonary disease who presented to the hospital with complaints of nausea, vomiting as well as diarrhea. According to patient, she was doing well up until approximately 9:30 p.m. when she started having nausea as well as episodes of vomiting. She denies any hematemesis, however, at around 11:00 p.m. she started having numerous episodes of diarrhea. She was so weak that she was about to collapse. She stated that she was not able to move her upper or lower extremities. Since about 11:00 p.m. she also noted slurring of her speech. That is why she decided to come to Emergency Room for further evaluation. In the Emergency Room she was noted to have abnormal EKG with right bundle branch block. Suddenly patient started having tachycardia, which was wide complex tachycardia with rate of around 200. Patient was given adenosine after which her tachycardia somewhat slowed down, however, patient went into atrial fibrillation/RVR. Emergency Room physician was about to give her Cardizem or amiodarone, however, patient converted into sinus tachycardia at rate of 120. She still remains in sinus tach at around rate of 128 now with normal systolic blood pressure. Initially on arrival to Emergency Room patient's systolic blood pressure was around 160. She denies any other symptoms such as lightheadedness, dizziness or chest pains and denies any palpitations or feeling palpitations at home.   PAST MEDICAL HISTORY:  1. Hypertension.  2. Hyperlipidemia which was diet controlled.  3. History of chronic obstructive pulmonary disease. 4. Hemorrhoids status post operation. 5. 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 Chronic right facial pain since that operation as well as intermittent dysphagia.   MEDICATIONS:  1. Diovan 160 mg p.o. daily.  2. Advair Diskus 100/50 twice daily. 3. ProctoFoam or Proctocream as needed for hemorrhoids. 4. Ibuprofen 1 to 2 tablets a month.   PAST SURGICAL HISTORY: As above.   ALLERGIES: Prednisone Dosepak which gives her hyperactivity.   FAMILY HISTORY: Hypertension in patient's father. Patient's mother had cerebrovascular accident at age of 79. Two brothers had diabetes, one brother died of CVA. Cancer in patient's paternal cousin who had breast cancer.   SOCIAL HISTORY: Patient is married, lives with her husband, has two children who live here in [place]. Patient used to live in [place] and that is where her primary care physician is, however, she just recently moved to [place]. She used to smoke 1 pack a day of cigarettes for 30 years, quit approximately 7 or 8 years ago. Drinks approximately 4 ounce glass of wine with dinner daily. 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   REVIEW OF SYSTEMS: CONSTITUTIONAL: Positive for fatigue and weakness, episode of slurring of speech, nausea, vomiting as well as diarrhea episode and arrhythmia in Emergency Room. Otherwise, denies any significant fevers, pains, weight loss or gain. EYES: In regards to eyes, denies any blurry vision, double vision, glaucoma, cataracts. ENT: Denies any tinnitus, allergies, epistaxis, sinus pain, dentures, difficulty swallowing. Admits of having some sinus problems in the past. RESPIRATORY: Denies any cough, wheezes, asthma, chronic obstructive pulmonary disease. CARDIOVASCULAR: Denies chest pain, orthopnea, edema, arrhythmias, palpitations, or syncope. GASTROINTESTINAL: Denies any abdominal pain, hematemesis, rectal bleeding, change in bowel habits. GENITOURINARY: Denies  dysuria, hematuria, frequency, incontinence. ENDOCRINOLOGY:  Denies any polydipsia, nocturia, thyroid problems, heat or cold intolerance, or thirst. HEMATOLOGIC: Denies anemia, easy bruising, bleeding, swollen glands. SKIN: Denies any acne, rash, lesions, change in moles. MUSCULOSKELETAL: Denies arthritis, cramps, swelling, gout. NEUROLOGIC: No numbness, epilepsy, or tremor. PSYCHIATRIC: Denies anxiety, insomnia, depression.  PHYSICAL EXAMINATION:  VITAL SIGNS: On arrival to hospital: Temperature 99.9, pulse 116, respiration rate 20, blood pressure 160/89, saturation 91% on room air.   GENERAL: This is a well nourished Caucasian female, pale and very dry with dry mucosa sitting on the stretcher.   HEENT: Her pupils are equal, reactive to light. Extraocular movements are intact. No icterus or conjunctivitis. Has normal hearing. No pharyngeal erythema. Mucosa is dry.   NECK: Neck did not reveal any masses. Supple, nontender. Thyroid not enlarged. No adenopathy. No JVD or carotid bruits bilaterally. Full range of motion.   LUNGS: Clear to auscultation in all fields. Mildly diminished breath sounds but otherwise no rales, rhonchi or wheezing. No labored inspirations, increased effort, dullness to percussion, overt respiratory distress.   CARDIOVASCULAR: S1, S2 appreciated. No murmurs, gallops, rubs noted. PMI not lateralized. Chest is nontender to palpation.   EXTREMITIES: 1+ pedal pulses. No lower extremity edema, calf tenderness, or cyanosis noted.   ABDOMEN: Soft, nontender. Bowel sounds are present. No hepatosplenomegaly or masses are noted.   RECTAL: Deferred.   MUSCULOSKELETAL: Able to move all extremities. No cyanosis, degenerative joint disease, or kyphosis. Gait is not tested.   SKIN: Skin did not reveal any rashes, lesions, erythema, nodularity, induration. It was warm and dry to palpation.   LYMPH: No adenopathy in cervical region.   NEUROLOGICAL: Patient does have right facial weakness as well as tongue deviation to the right, otherwise,  deep tendon reflexes are intact. Sensory is intact. Patient does have dysarthria, slurring of speech but no aphasia.   PSYCH: Patient is alert, oriented to time, person, place, cooperative. Memory is good. No significant confusion, agitation, depression noted.   LABORATORY, DIAGNOSTIC, AND RADIOLOGICAL DATA: BMP showed glucose 145, otherwise unremarkable. Magnesium level low at 1.6, lipase level normal at 198. Liver enzymes were normal. Cardiac enzymes, first set, negative. CBC: White blood cell count 16.5, hemoglobin 15.1, platelets 247. Urinalysis: Yellow hazy urine, negative for glucose or bilirubin, trace ketones, specific gravity 1.016, pH 7.0, 1+ blood, negative for protein, nitrites, or leukocyte esterase, 4 red blood cells, 2 white blood cells, 3+ bacteria, 4 epithelial cells, mucous as well as amorphous crystal is present. Chest xray revealed cardiomegaly. A few EKGs were performed. First one which was done in the Emergency Room at around 41 minutes after midnight showed sinus tachycardia at 116 beats per minute, left axis deviation, right bundle branch block, inferior infarct age undetermined, anterolateral infarct age undetermined. Repeated EKG at around 2:00 a.m. showed sinus tachycardia with left axis deviation, right bundle branch block as well as inferior infarct at rate of 122 beats per minute. Repeated EKG at around 2:00 p.m. showed wide QRS tachycardia at 161 beats per minute, right bundle branch block, inferior infarct age undetermined, anterolateral infarct age undetermined then atrial fibrillation with rapid ventricular response at rate of 160, nonspecific ST-T changes as well.  ASSESSMENT AND PLAN:  1. Acute gastroenteritis. Unclear, questionable infectious based on patient's fever as well as leukocytosis. Will get stool cultures and will start patient on Rocephin IV.  2. Presyncope possibly arrhythmia related, however, cannot rule out orthostatic hypotension related. Will get  echocardiogram, carotid ultrasound as well as orthostatic  vital signs.  3. Arrhythmia, nonspecified. Get echocardiogram. Start patient on low dose of metoprolol. Replenish magnesium level and will get cardiologist involved for recommendations. Will check cardiac enzymes x3 as well as TSH.  4. Leukocytosis possibly infection related, acute gastroenteritis related. Will follow with antibiotic therapy. 5. Dysarthria, slurring speech. Will get CT scan of head. Will also continue patient on aspirin therapy. As mentioned above will get echocardiogram as well as carotid ultrasound, however, patient may need to have MRI of brain if her speech does not improve with hydration alone.   TIME SPENT: One hour.   ____________________________ Katharina Caper, MD rv:cms D: 08/06/2011 04:35:11 ET T: 08/06/2011 08:44:12 ET JOB#: [id number]  cc: Katharina Caper, MD, <Dictator> [facility] Pierre Cumpton MD ELECTRONICALLY SIGNED 08/09/2011 10:52
